# Patient Record
Sex: Male | Born: 1985
Health system: Southern US, Community
[De-identification: ages and names within clinical notes are randomized; demographics above are authoritative.]

---

## 2011-05-27 ENCOUNTER — Encounter: Payer: Self-pay | Admitting: *Deleted

## 2011-05-27 ENCOUNTER — Emergency Department (HOSPITAL_BASED_OUTPATIENT_CLINIC_OR_DEPARTMENT_OTHER)
Admission: EM | Admit: 2011-05-27 | Discharge: 2011-05-27 | Disposition: A | Discharge status: Home / Self Care | Attending: Emergency Medicine | Admitting: Emergency Medicine

## 2011-05-27 DIAGNOSIS — F17200 Nicotine dependence, unspecified, uncomplicated: Secondary | ICD-10-CM | POA: Insufficient documentation

## 2011-05-27 DIAGNOSIS — W01119A Fall on same level from slipping, tripping and stumbling with subsequent striking against unspecified sharp object, initial encounter: Secondary | ICD-10-CM | POA: Insufficient documentation

## 2011-05-27 DIAGNOSIS — S0181XA Laceration without foreign body of other part of head, initial encounter: Secondary | ICD-10-CM

## 2011-05-27 DIAGNOSIS — S0180XA Unspecified open wound of other part of head, initial encounter: Secondary | ICD-10-CM | POA: Insufficient documentation

## 2011-05-27 DIAGNOSIS — W268XXA Contact with other sharp object(s), not elsewhere classified, initial encounter: Secondary | ICD-10-CM | POA: Insufficient documentation

## 2011-05-27 NOTE — ED Notes (Signed)
Pt reports carrying a box and tripping and falling on to the box.  Pt reports lac to left chin.

## 2011-05-27 NOTE — ED Provider Notes (Addendum)
History     CSN: [id number] Arrival date & time: 05/27/2011 12:10 PM  Chief Complaint  Patient presents with  . Laceration   HPI Comments: Pt states that he tripped and fell and hit his chin on a metal box  Patient is a 25 y.o. male presenting with skin laceration. The history is provided by the patient. No language interpreter was used.  Laceration  The incident occurred less than 1 hour ago. The laceration is located on the face. The laceration is 3 cm in size. The laceration mechanism was a a metal edge. The pain is mild. The pain has been constant since onset. He reports no foreign bodies present. His tetanus status is UTD.    History reviewed. No pertinent past medical history.  History reviewed. No pertinent past surgical history.  History reviewed. No pertinent family history.  History  Substance Use Topics  . Smoking status: Current Everyday Smoker  . Smokeless tobacco: Not on file  . Alcohol Use: 14.4 oz/week    24 Cans of beer per week      Review of Systems  All other systems reviewed and are negative.    Physical Exam  BP 131/91  Pulse 90  Temp(Src) 98.1 F (36.7 C) (Oral)  Resp 18  Ht 6\' 2"  (1.88 m)  Wt 185 lb (83.915 kg)  BMI 23.75 kg/m2  SpO2 100%  Physical Exam  Nursing note and vitals reviewed. Constitutional: He is oriented to person, place, and time. He appears well-developed and well-nourished.  HENT:  Head: Normocephalic and atraumatic.       Pt is able to open and close mouth without any problem:no fractured or loose teeth noted  Neck: Normal range of motion. Neck supple.  Cardiovascular: Normal rate and regular rhythm.   Pulmonary/Chest: Effort normal and breath sounds normal.  Musculoskeletal: Normal range of motion.  Neurological: He is alert and oriented to person, place, and time.  Skin:       Pt has an abrasion to the left knee and laceration to the left chin    ED Course  LACERATION REPAIR Date/Time: 05/27/2011 1:44  PM Performed by: Teressa Lower Authorized by: Lear Ng. Consent: Verbal consent obtained. Risks and benefits: risks, benefits and alternatives were discussed Consent given by: patient Patient understanding: patient states understanding of the procedure being performed Patient identity confirmed: verbally with patient Time out: Immediately prior to procedure a "time out" was called to verify the correct patient, procedure, equipment, support staff and site/side marked as required. Body area: head/neck Location details: chin Laceration length: 3 cm Foreign bodies: no foreign bodies Tendon involvement: none Nerve involvement: none Vascular damage: no Anesthesia: local infiltration Local anesthetic: lidocaine 2% without epinephrine Preparation: Patient was prepped and draped in the usual sterile fashion. Irrigation solution: saline Irrigation method: syringe Amount of cleaning: standard Debridement: none Degree of undermining: none Skin closure: 5-0 Prolene Number of sutures: 8 Technique: simple Approximation: close Approximation difficulty: simple Patient tolerance: Patient tolerated the procedure well with no immediate complications.    MDM Wound closed without any problem:no sign or dental or jaw injury      Teressa Lower, NP 05/27/11 1346   Evaluation and management procedures were performed by the mid-level provider (PA/NP/CNM) under my supervision/collaboration. I was present and available during the ED course.    Gavin Pound. Oletta Lamas, MD 05/27/11 1353  Gavin Pound. Jentri Aye, MD 05/27/11 1354

## 2011-05-27 NOTE — ED Notes (Signed)
Pt has had tetanus shot within the last 5 years.

## 2011-06-03 ENCOUNTER — Encounter (HOSPITAL_BASED_OUTPATIENT_CLINIC_OR_DEPARTMENT_OTHER): Payer: Self-pay

## 2011-06-03 ENCOUNTER — Emergency Department (HOSPITAL_BASED_OUTPATIENT_CLINIC_OR_DEPARTMENT_OTHER)
Admission: EM | Admit: 2011-06-03 | Discharge: 2011-06-03 | Disposition: A | Discharge status: Home / Self Care | Attending: Emergency Medicine | Admitting: Emergency Medicine

## 2011-06-03 DIAGNOSIS — F17200 Nicotine dependence, unspecified, uncomplicated: Secondary | ICD-10-CM | POA: Insufficient documentation

## 2011-06-03 DIAGNOSIS — Z4802 Encounter for removal of sutures: Secondary | ICD-10-CM | POA: Insufficient documentation

## 2011-06-03 NOTE — ED Notes (Signed)
Bacitracin applied

## 2011-06-03 NOTE — ED Notes (Signed)
Suture removal from chin

## 2011-06-03 NOTE — ED Provider Notes (Signed)
History     CSN: [id number] Arrival date & time: 06/03/2011  3:59 PM  Chief Complaint  Patient presents with  . Suture / Staple Removal   HPI Comments: Here for suture removal after chin laceration. Sutures placed 7 days ago. Denies fever/chills/pus drainage/erythema. Pain controlled. Doing well at home, denies other complaints. States that "one popped out" and he tried to take them out himself, was able to remove one. (total 6 suture currently in place)  Patient is a 25 y.o. male presenting with suture removal.  Suture / Staple Removal     History reviewed. No pertinent past medical history.  History reviewed. No pertinent past surgical history.  No family history on file.  History  Substance Use Topics  . Smoking status: Current Everyday Smoker  . Smokeless tobacco: Not on file  . Alcohol Use: 14.4 oz/week    24 Cans of beer per week      Review of Systems  All other systems reviewed and are negative.  except as noted HPI   Physical Exam  BP 154/71  Pulse 73  Temp(Src) 98.2 F (36.8 C) (Oral)  Resp 16  Wt 190 lb (86.183 kg)  SpO2 100%  Physical Exam  Nursing note and vitals reviewed. Constitutional: He is oriented to person, place, and time. He appears well-developed and well-nourished. No distress.  HENT:  Head: Atraumatic.  Mouth/Throat: Oropharynx is clear and moist.  Eyes: Conjunctivae are normal. Pupils are equal, round, and reactive to light.  Neck: Neck supple.  Cardiovascular: Normal rate, regular rhythm, normal heart sounds and intact distal pulses.  Exam reveals no gallop and no friction rub.   No murmur heard. Pulmonary/Chest: Effort normal. No respiratory distress. He has no wheezes. He has no rales.  Abdominal: Soft. Bowel sounds are normal. There is no tenderness. There is no rebound and no guarding.  Musculoskeletal: Normal range of motion. He exhibits no edema and no tenderness.  Neurological: He is alert and oriented to person, place, and  time.  Skin: Skin is warm and dry.  Psychiatric: He has a normal mood and affect.    ED Course  SUTURE REMOVAL Date/Time: 06/03/2011 4:05 PM Performed by: Forbes Cellar Authorized by: Forbes Cellar Consent: Verbal consent obtained. Consent given by: patient Patient understanding: patient states understanding of the procedure being performed Patient consent: the patient's understanding of the procedure matches consent given Procedure consent: procedure consent matches procedure scheduled Relevant documents: relevant documents present and verified Patient identity confirmed: verbally with patient Time out: Immediately prior to procedure a "time out" was called to verify the correct patient, procedure, equipment, support staff and site/side marked as required. Body area: head/neck Location details: chin Wound Appearance: clean Sutures Removed: 6 Post-removal: antibiotic ointment applied Patient tolerance: Patient tolerated the procedure well with no immediate complications.    MDM  Here for suture removal. Wound healing well without evidence of infection. Sutures removed without complication  Stefano Gaul, MD       Forbes Cellar, MD 06/03/11 [phone]

## 2017-05-08 ENCOUNTER — Emergency Department (HOSPITAL_BASED_OUTPATIENT_CLINIC_OR_DEPARTMENT_OTHER)

## 2017-05-08 ENCOUNTER — Encounter (HOSPITAL_BASED_OUTPATIENT_CLINIC_OR_DEPARTMENT_OTHER): Payer: Self-pay | Admitting: *Deleted

## 2017-05-08 ENCOUNTER — Emergency Department (HOSPITAL_BASED_OUTPATIENT_CLINIC_OR_DEPARTMENT_OTHER)
Admission: EM | Admit: 2017-05-08 | Discharge: 2017-05-08 | Disposition: A | Discharge status: Home / Self Care | Attending: Emergency Medicine | Admitting: Emergency Medicine

## 2017-05-08 DIAGNOSIS — Z87891 Personal history of nicotine dependence: Secondary | ICD-10-CM | POA: Insufficient documentation

## 2017-05-08 DIAGNOSIS — R600 Localized edema: Secondary | ICD-10-CM | POA: Insufficient documentation

## 2017-05-08 DIAGNOSIS — M79675 Pain in left toe(s): Secondary | ICD-10-CM | POA: Insufficient documentation

## 2017-05-08 MED ORDER — TRAMADOL HCL 50 MG PO TABS: 50.0000 mg | ORAL_TABLET | Freq: Four times a day (QID) | ORAL | 0 refills | Status: DC | PRN

## 2017-05-08 MED ORDER — CEPHALEXIN 500 MG PO CAPS: 500.0000 mg | ORAL_CAPSULE | Freq: Four times a day (QID) | ORAL | 0 refills | Status: DC

## 2017-05-08 MED FILL — CEPHALEXIN 500 MG CAPSULE: 500 | 5 days supply | Qty: 20 | Fill #0

## 2017-05-08 MED FILL — traMADol HCL 50 MG TABS: 50 | 2 days supply | Qty: 8 | Fill #0

## 2017-05-08 NOTE — ED Provider Notes (Signed)
[facility]   CSN: [id number] Arrival date & time: 05/08/17  1037     History   Chief Complaint Chief Complaint  Patient presents with  . Toe Injury    HPI Andrew Mahoney is a 31 y.o. male who was previously healthy who presents with a three-day history of left great toe pain. Patient reports stubbing his toe 3 days ago, causing the toe to bend sideways. Patient reports progressively worsening pain, redness, swelling, warmth to the area. He denies any fevers. He is taking 800 mg ibuprofen at home without relief. He is able to bear weight, however with significant pain.  HPI  History reviewed. No pertinent past medical history.  There are no active problems to display for this patient.   History reviewed. No pertinent surgical history.     Home Medications    Prior to Admission medications   Medication Sig Start Date End Date Taking? Authorizing Provider  cephALEXin (KEFLEX) 500 MG capsule Take 1 capsule (500 mg total) by mouth 4 (four) times daily. 05/08/17   Dorothey Oetken, Waylan BogaAlexandra M, PA-C  traMADol (ULTRAM) 50 MG tablet Take 1 tablet (50 mg total) by mouth every 6 (six) hours as needed. 05/08/17   Emi HolesLaw, Willey Due M, PA-C    Family History History reviewed. No pertinent family history.  Social History Social History  Substance Use Topics  . Smoking status: Former Games developermoker  . Smokeless tobacco: Current User  . Alcohol use 1.8 oz/week    3 Cans of beer per week     Allergies   Patient has no known allergies.   Review of Systems Review of Systems  Constitutional: Negative for fever.  Musculoskeletal: Positive for arthralgias. Negative for back pain.  Skin: Positive for color change. Negative for rash and wound.  Psychiatric/Behavioral: The patient is not nervous/anxious.      Physical Exam Updated Vital Signs BP (!) 155/111 (BP Location: Right Arm)   Pulse 76   Temp 98.5 F (36.9 C) (Oral)   Resp 18   Ht 6' (1.829 m)   Wt 97.5 kg  (215 lb)   SpO2 100%   BMI 29.16 kg/m   Physical Exam  Constitutional: He appears well-developed and well-nourished. No distress.  HENT:  Head: Normocephalic and atraumatic.  Mouth/Throat: Oropharynx is clear and moist. No oropharyngeal exudate.  Eyes: Pupils are equal, round, and reactive to light. Conjunctivae are normal. Right eye exhibits no discharge. Left eye exhibits no discharge. No scleral icterus.  Neck: Normal range of motion. Neck supple. No thyromegaly present.  Cardiovascular: Normal rate, regular rhythm, normal heart sounds and intact distal pulses.  Exam reveals no gallop and no friction rub.   No murmur heard. Pulmonary/Chest: Effort normal and breath sounds normal. No stridor. No respiratory distress. He has no wheezes. He has no rales.  Musculoskeletal: He exhibits no edema.  Left great toe tender at the MTP; associated erythema and significant warmth of the entire toe tracking toward midfoot - see photo  Lymphadenopathy:    He has no cervical adenopathy.  Neurological: He is alert. Coordination normal.  Skin: Skin is warm and dry. No rash noted. He is not diaphoretic. No pallor.  Psychiatric: He has a normal mood and affect.  Nursing note and vitals reviewed.      ED Treatments / Results  Labs (all labs ordered are listed, but only abnormal results are displayed) Labs Reviewed - No data to display  EKG  EKG Interpretation None  Radiology Dg Toe Great Left  Result Date: 05/08/2017 CLINICAL DATA:  Toe injury with pain and swelling EXAM: LEFT GREAT TOE COMPARISON:  None. FINDINGS: There is no evidence of fracture or dislocation. There is no evidence of arthropathy or other focal bone abnormality. Soft tissues are unremarkable. IMPRESSION: Negative. Electronically Signed   By: Marlan Palau M.D.   On: 05/08/2017 11:13    Procedures Procedures (including critical care time)  Medications Ordered in ED Medications - No data to display   Initial  Impression / Assessment and Plan / ED Course  I have reviewed the triage vital signs and the nursing notes.  Pertinent labs & imaging results that were available during my care of the patient were reviewed by me and considered in my medical decision making (see chart for details).     X-ray of left great toe as needed. Suspect cellulitis considering warmth and erythema. Will cover with Keflex and outpatient return or see PCP for wound check in 2 days. Area of redness and warmth demarcated with skin marker. We will also discharged home with short course of tramadol. I reviewed the [place] narcotic database and found her discrepancies. Strict return precautions given. Patient understands and agrees with plan. Patient vitals stable throughout ED course discharged in satisfactory condition.  Final Clinical Impressions(s) / ED Diagnoses   Final diagnoses:  Great toe pain, left    New Prescriptions Discharge Medication List as of 05/08/2017 12:10 PM    START taking these medications   Details  cephALEXin (KEFLEX) 500 MG capsule Take 1 capsule (500 mg total) by mouth 4 (four) times daily., Starting Mon 05/08/2017, Print    traMADol (ULTRAM) 50 MG tablet Take 1 tablet (50 mg total) by mouth every 6 (six) hours as needed., Starting Mon 05/08/2017, Print         [address] [place], PA-C 05/08/17 1629    Maia Plan, MD 05/08/17 [phone]

## 2017-05-08 NOTE — Discharge Instructions (Signed)
Medications: Keflex, tramadol  Treatment: Take Keflex 4 times daily for 5 days. Take tramadol OR ibuprofen for your pain. You can alternate Tylenol with either one, do not take tramadol and ibuprofen together. Use ice 3-4 times daily alternating 20 minutes on, 20 minutes off. Keep your foot elevated whenever you're not walking on it.  Do not drink alcohol, drive, operate machinery or participate in any other potentially dangerous activities while taking opiate pain medication as it may make you sleepy. Do not take this medication with any other sedating medications, either prescription or over-the-counter. If you were prescribed Percocet or Vicodin, do not take these with acetaminophen (Tylenol) as it is already contained within these medications and overdose of Tylenol is dangerous.   This medication is an opiate (or narcotic) pain medication and can be habit forming.  Use it as little as possible to achieve adequate pain control.  Do not use or use it with extreme caution if you have a history of opiate abuse or dependence. This medication is intended for your use only - do not give any to anyone else and keep it in a secure place where nobody else, especially children, have access to it. It will also cause or worsen constipation, so you may want to consider taking an over-the-counter stool softener while you are taking this medication.   Follow-up: Please see your doctor or return here for wound check in 2 days unless symptoms are completely resolved. If your symptoms are persisting following treatment with antibiotics, please follow-up with podiatrist below. Please return sooner if you develop any increasing redness and swelling past the line of demarcation. Please return with any other new or worsening symptoms as well.

## 2017-05-08 NOTE — ED Triage Notes (Signed)
Pt c/o left big toe injury x 2 days

## 2017-05-08 NOTE — ED Notes (Signed)
Stubbed  Left big toe  2 days ago now foot red and swelling tender to touch and wiggle toes, has good pulse and neuro

## 2017-05-15 ENCOUNTER — Encounter (HOSPITAL_BASED_OUTPATIENT_CLINIC_OR_DEPARTMENT_OTHER): Payer: Self-pay | Admitting: *Deleted

## 2017-05-15 ENCOUNTER — Emergency Department (HOSPITAL_BASED_OUTPATIENT_CLINIC_OR_DEPARTMENT_OTHER)
Admission: EM | Admit: 2017-05-15 | Discharge: 2017-05-15 | Disposition: A | Discharge status: Home / Self Care | Attending: Emergency Medicine | Admitting: Emergency Medicine

## 2017-05-15 DIAGNOSIS — R030 Elevated blood-pressure reading, without diagnosis of hypertension: Secondary | ICD-10-CM | POA: Insufficient documentation

## 2017-05-15 DIAGNOSIS — M79675 Pain in left toe(s): Secondary | ICD-10-CM | POA: Diagnosis present

## 2017-05-15 DIAGNOSIS — M109 Gout, unspecified: Secondary | ICD-10-CM | POA: Insufficient documentation

## 2017-05-15 DIAGNOSIS — F17290 Nicotine dependence, other tobacco product, uncomplicated: Secondary | ICD-10-CM | POA: Insufficient documentation

## 2017-05-15 MED ORDER — PREDNISONE 20 MG PO TABS: 40.0000 mg | ORAL_TABLET | Freq: Every day | ORAL | 0 refills | Status: AC

## 2017-05-15 MED ORDER — NAPROXEN 500 MG PO TABS: 500.0000 mg | ORAL_TABLET | Freq: Two times a day (BID) | ORAL | 0 refills | Status: AC

## 2017-05-15 MED FILL — predniSONE 20 MG TABS: 20 | 5 days supply | Qty: 10 | Fill #0

## 2017-05-15 MED FILL — NAPROXEN 500 MG TABLET: 500 | 15 days supply | Qty: 30 | Fill #0

## 2017-05-15 NOTE — ED Triage Notes (Signed)
Recheck left great toe. Swelling continues. Redness. He thinks he has gout.

## 2017-05-15 NOTE — ED Provider Notes (Signed)
[facility]   CSN: [id number] Arrival date & time: 05/15/17  1555     History   Chief Complaint No chief complaint on file.   HPI Andrew Mahoney is a 31 y.o. male.  Andrew Mahoney is a 31 y.o. Male who presents the emergency department complaining of ongoing left great toe pain. Patient reports he was seen in the emergency department several days ago and had an x-ray of his great toe that was unremarkable. He was started on Keflex and tramadol without relief. He reports his pain is persisted. He denies any severe injury to his toe. He is concerned this may be gout. He denies history of gout previously. He is able to move a however he reports it hurts worse with ambulation and with touching of the area. No other joint pain or swelling. He denies fevers, numbness, tingling or weakness.   The history is provided by the patient and medical records. No language interpreter was used.    History reviewed. No pertinent past medical history.  There are no active problems to display for this patient.   History reviewed. No pertinent surgical history.     Home Medications    Prior to Admission medications   Medication Sig Start Date End Date Taking? Authorizing Provider  naproxen (NAPROSYN) 500 MG tablet Take 1 tablet (500 mg total) by mouth 2 (two) times daily with a meal. 05/15/17   Everlene Farrieransie, Azaiah Mello, PA-C  predniSONE (DELTASONE) 20 MG tablet Take 2 tablets (40 mg total) by mouth daily. 05/15/17   Everlene Farrieransie, Abad Manard, PA-C    Family History No family history on file.  Social History Social History  Substance Use Topics  . Smoking status: Former Games developermoker  . Smokeless tobacco: Current User  . Alcohol use 1.8 oz/week    3 Cans of beer per week     Allergies   Patient has no known allergies.   Review of Systems Review of Systems  Constitutional: Negative for chills and fever.  Musculoskeletal: Positive for arthralgias.  Skin: Negative for  rash and wound.  Neurological: Negative for weakness and numbness.     Physical Exam Updated Vital Signs BP (!) 148/104 (BP Location: Right Arm)   Pulse 81   Temp 98.4 F (36.9 C) (Oral)   Resp 18   Ht 6' (1.829 m)   Wt 97.5 kg (215 lb)   SpO2 100%   BMI 29.16 kg/m   Physical Exam  Constitutional: He appears well-developed and well-nourished. No distress.  HENT:  Head: Normocephalic and atraumatic.  Eyes: Right eye exhibits no discharge. Left eye exhibits no discharge.  Cardiovascular: Normal rate, regular rhythm and intact distal pulses.   Bilateral dorsalis pedis and posterior tibialis pulses are intact. Good capillary refill to his distal toes.   Pulmonary/Chest: Effort normal. No respiratory distress.  Musculoskeletal: He exhibits tenderness. He exhibits no deformity.  Mild warmth and tenderness noted to his left great toe. Patient has good range of motion of his toe, albumin with pain. Other joint swelling or tenderness noted.  Neurological: He is alert. No sensory deficit. He exhibits normal muscle tone. Coordination normal.  Skin: Skin is warm and dry. Capillary refill takes less than 2 seconds. No rash noted. He is not diaphoretic. There is erythema. No pallor.  Psychiatric: He has a normal mood and affect. His behavior is normal.  Nursing note and vitals reviewed.    ED Treatments / Results  Labs (all labs ordered are listed, but only abnormal  results are displayed) Labs Reviewed - No data to display  EKG  EKG Interpretation None       Radiology No results found.  Procedures Procedures (including critical care time)  Medications Ordered in ED Medications - No data to display   Initial Impression / Assessment and Plan / ED Course  I have reviewed the triage vital signs and the nursing notes.  Pertinent labs & imaging results that were available during my care of the patient were reviewed by me and considered in my medical decision making (see chart  for details).    This is a 31 y.o. Male who presents the emergency department complaining of ongoing left great toe pain. Patient reports he was seen in the emergency department several days ago and had an x-ray of his great toe that was unremarkable. He was started on Keflex and tramadol without relief. He reports his pain is persisted. He denies any severe injury to his toe. He is concerned this may be gout. He denies history of gout previously. He is able to move a however he reports it hurts worse with ambulation and with touching of the area. No other joint pain or swelling. On exam patient is afebrile nontoxic appearing. His mild tenderness and warmth noted to his left great toe. No deformity or ecchymosis noted. Is good range of motion of his toe, albumin pain. No joint swelling or edema noted. He reports he does drink alcohol and his diet has been worse recently. Suspect this is gout. Will start on naproxen on a course of steroids. He has also noted to be hypertensive. Encouraged follow-up with primary care for blood pressure recheck and podiatry if his pain persists. I advised the patient to follow-up with their primary care provider this week. I advised the patient to return to the emergency department with new or worsening symptoms or new concerns. The patient verbalized understanding and agreement with plan.      Final Clinical Impressions(s) / ED Diagnoses   Final diagnoses:  Acute gout involving toe of left foot, unspecified cause  Elevated blood pressure reading    New Prescriptions New Prescriptions   NAPROXEN (NAPROSYN) 500 MG TABLET    Take 1 tablet (500 mg total) by mouth 2 (two) times daily with a meal.   PREDNISONE (DELTASONE) 20 MG TABLET    Take 2 tablets (40 mg total) by mouth daily.     Everlene Farrier, PA-C 05/15/17 1728    Tegeler, Canary Brim, MD 05/16/17 [phone]

## 2018-09-29 IMAGING — CR DG TOE GREAT 2+V*L*
3 series · 3 of 3 positions shown · non-contrast
Comparison: None.

CLINICAL DATA: Toe injury with pain and swelling

EXAM:
LEFT GREAT TOE

[t toes ap left]
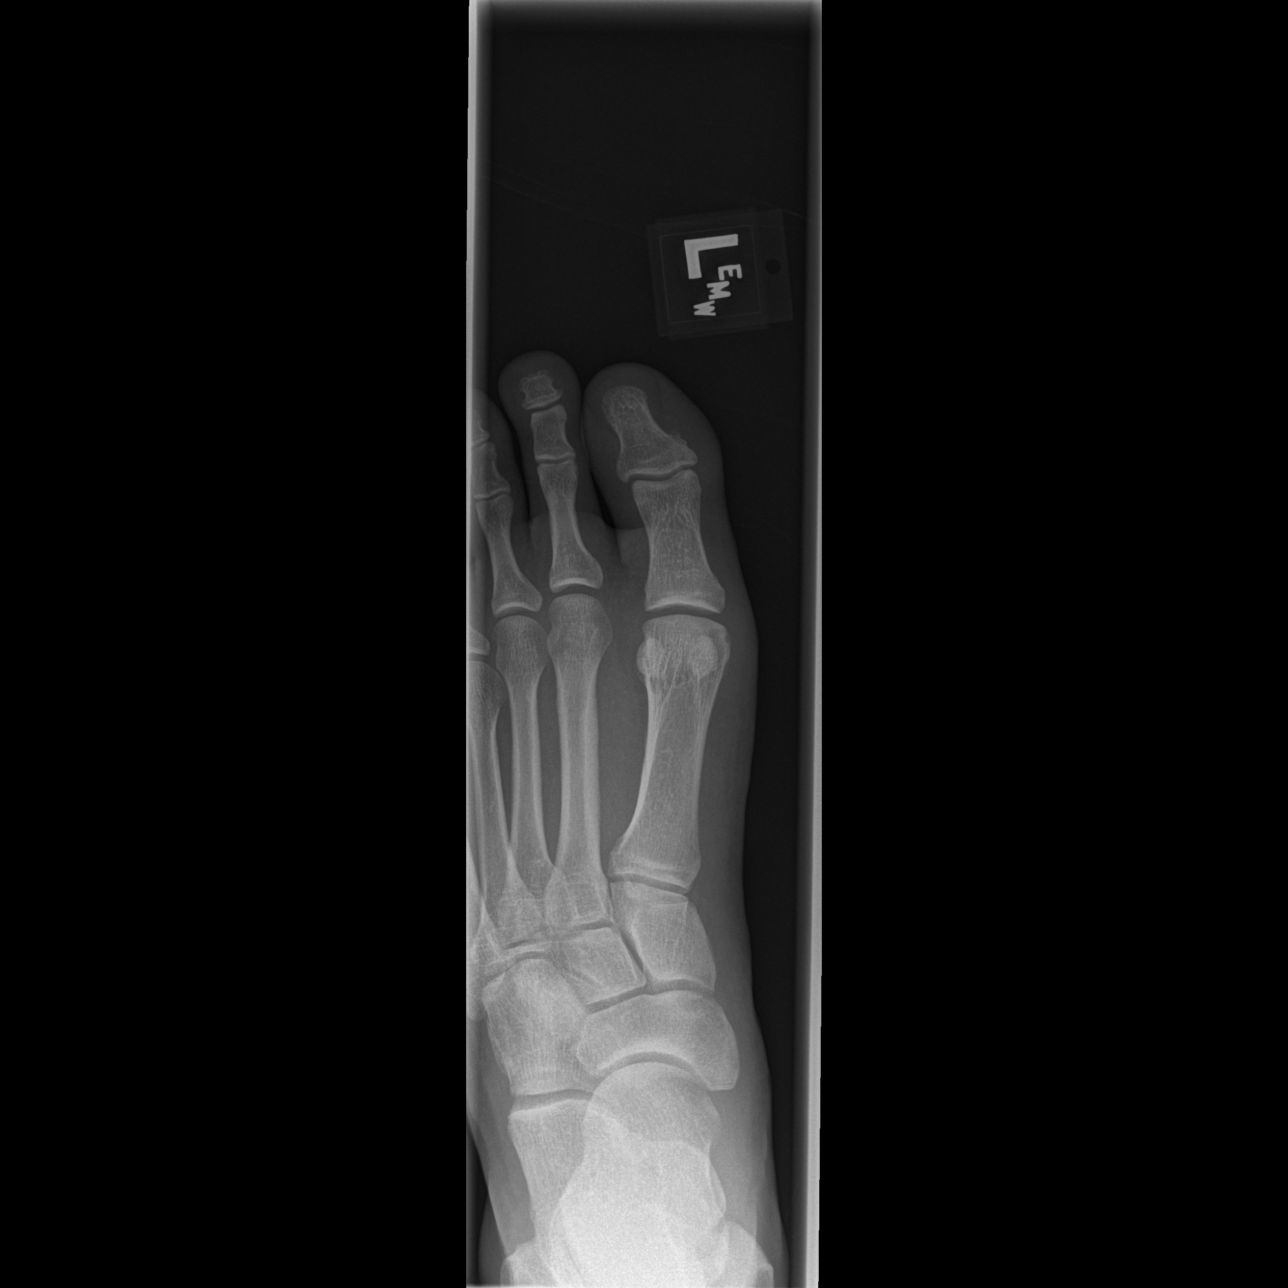

[t toes oblique left]
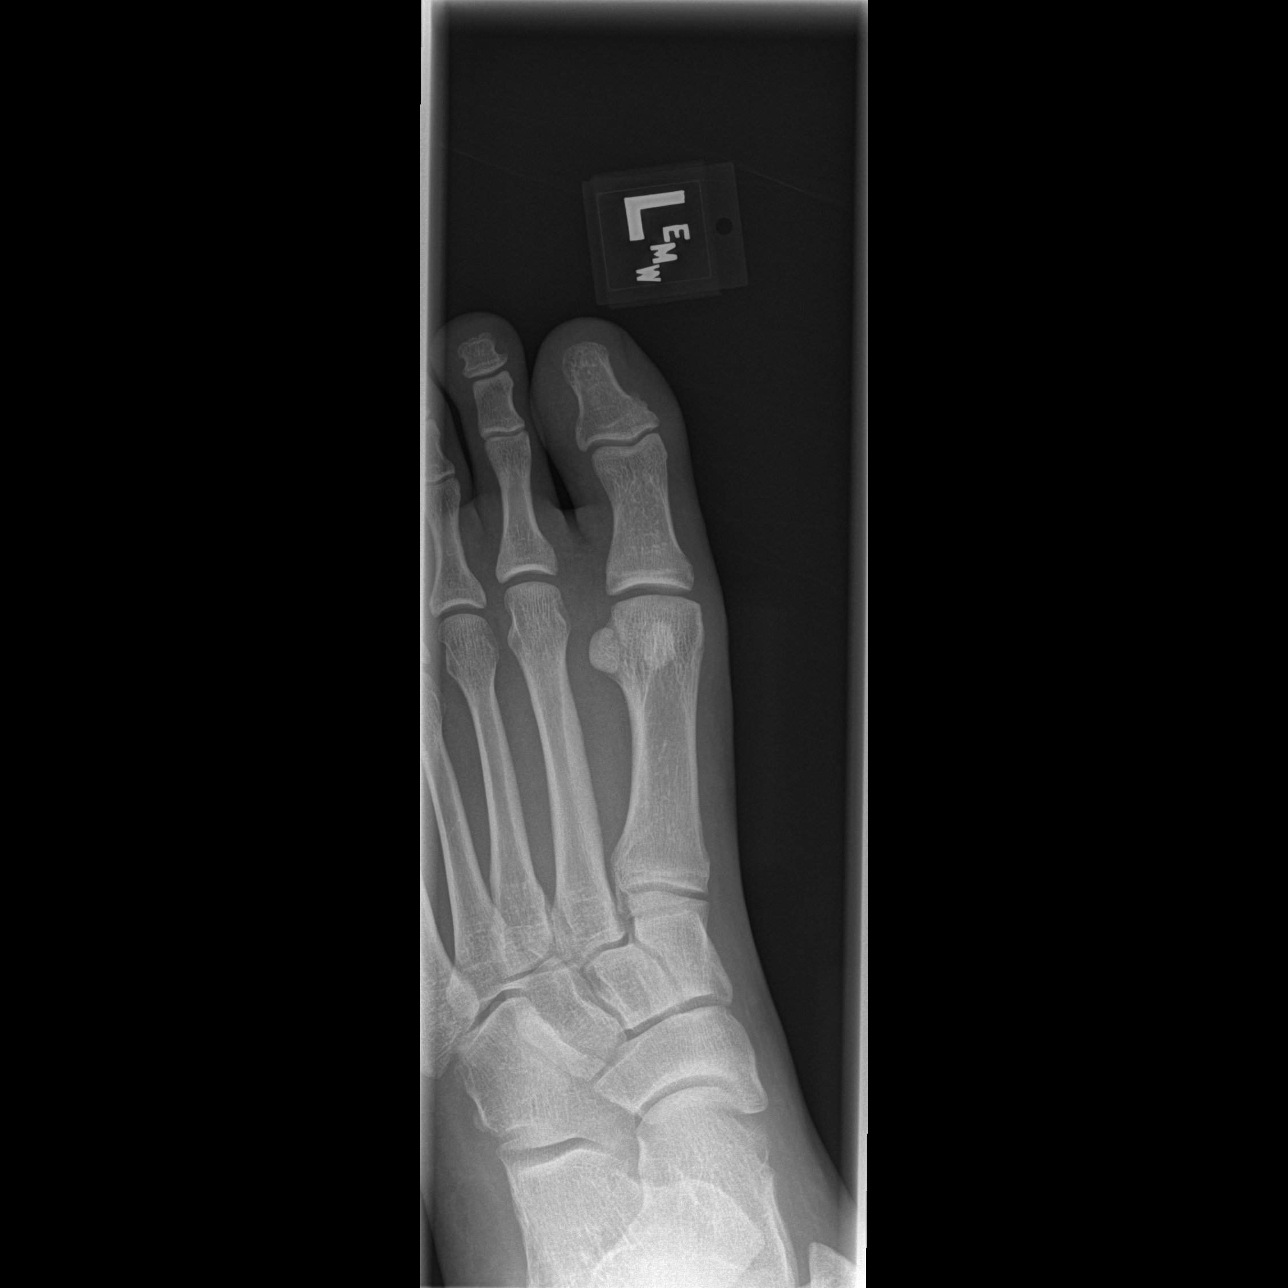

[t toes lateral left]
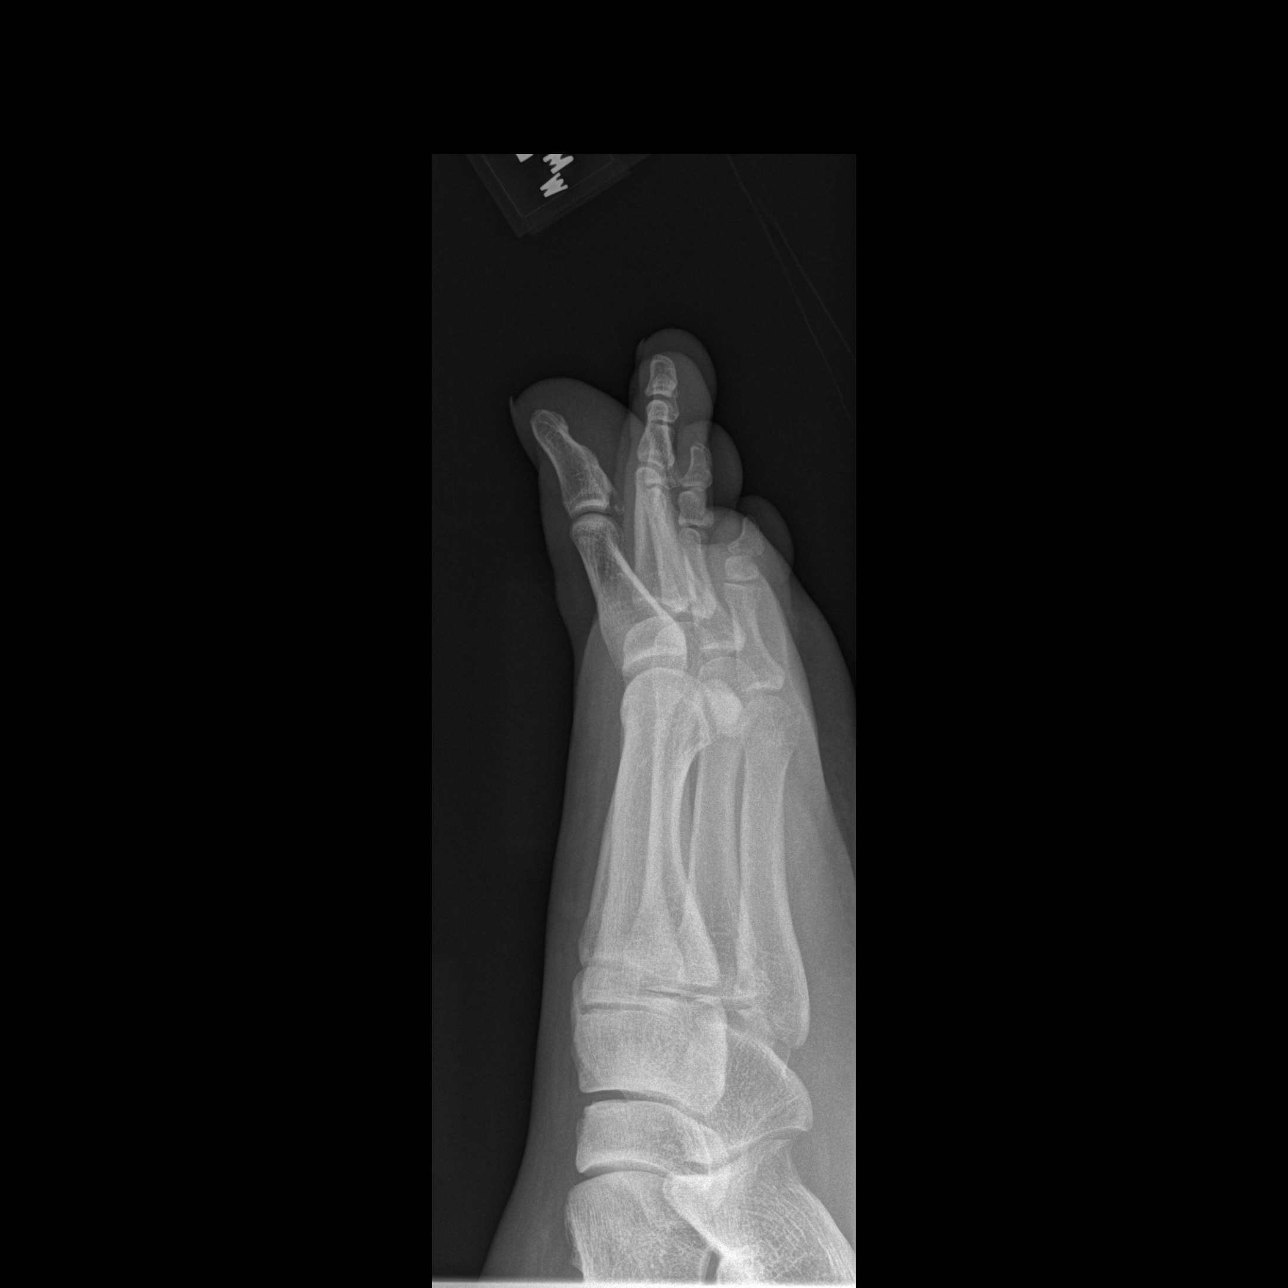

[3 of 3 positions shown; findings below may reference images not displayed]

FINDINGS: There is no evidence of fracture or dislocation. There is no
evidence of arthropathy or other focal bone abnormality. Soft
tissues are unremarkable.
IMPRESSION: Negative.
